# Patient Record
Sex: Male | Born: 1990 | Race: Black or African American | Hispanic: No | Marital: Single | Smoking: Never smoker
Health system: Southern US, Community
[De-identification: ages and names within clinical notes are randomized; demographics above are authoritative.]

---

## 2014-03-06 ENCOUNTER — Encounter (HOSPITAL_COMMUNITY): Payer: Self-pay | Admitting: Emergency Medicine

## 2014-03-06 ENCOUNTER — Emergency Department (HOSPITAL_COMMUNITY)
Admission: EM | Admit: 2014-03-06 | Discharge: 2014-03-06 | Discharge status: Against Medical Advice | Payer: BC Managed Care – PPO | Attending: Emergency Medicine | Admitting: Emergency Medicine

## 2014-03-06 DIAGNOSIS — R21 Rash and other nonspecific skin eruption: Secondary | ICD-10-CM | POA: Insufficient documentation

## 2014-03-06 NOTE — ED Provider Notes (Signed)
Patient left the ED without being seen after triage. This provider did not interview nor examine the patient. This provider did not partake in any medical decision-making for the patient.  Raymon MuttonMarissa Janaysha Depaulo, PA-C 03/07/14 [phone]

## 2014-03-06 NOTE — ED Notes (Signed)
Pt states he has a rash on his arms bilaterally and some on his lips.  No respiratory distress noted in triage.  Pt states he noted the "bumps" a little four days prior but mainly today.

## 2014-03-06 NOTE — ED Notes (Signed)
Pt walked out of room to nurses station and stated "I am just going to go." HendersonMarissa PA had not been in to assess patient. Marissa PA at nurses station when pt walked up and asked patient why he wanted to leave and patient stated he just wanted to go and so patient left ER with steady gait, NAD. Pt left without being seen.

## 2014-03-16 ENCOUNTER — Other Ambulatory Visit: Payer: Self-pay | Admitting: Emergency Medicine

## 2014-03-20 ENCOUNTER — Emergency Department (HOSPITAL_COMMUNITY)
Admission: EM | Admit: 2014-03-20 | Discharge: 2014-03-20 | Disposition: A | Discharge status: Home / Self Care | Payer: BC Managed Care – PPO | Attending: Emergency Medicine | Admitting: Emergency Medicine

## 2014-03-20 DIAGNOSIS — R21 Rash and other nonspecific skin eruption: Secondary | ICD-10-CM | POA: Insufficient documentation

## 2014-03-20 DIAGNOSIS — L509 Urticaria, unspecified: Secondary | ICD-10-CM | POA: Insufficient documentation

## 2014-03-20 MED ORDER — TRIAMCINOLONE ACETONIDE 0.1 % EX CREA: 1.0000 "application " | TOPICAL_CREAM | Freq: Two times a day (BID) | CUTANEOUS | Status: DC

## 2014-03-20 MED ORDER — HYDROXYZINE HCL 10 MG PO TABS: 10.0000 mg | ORAL_TABLET | Freq: Four times a day (QID) | ORAL | Status: DC | PRN

## 2014-03-20 MED ORDER — CEPHALEXIN 500 MG PO CAPS: 500.0000 mg | ORAL_CAPSULE | Freq: Four times a day (QID) | ORAL | Status: DC

## 2014-03-20 MED ORDER — OXYCODONE-ACETAMINOPHEN 5-325 MG PO TABS
1.0000 | ORAL_TABLET | Freq: Once | ORAL | Status: AC
  Administered 2014-03-20: 1 via ORAL
  Filled 2014-03-20: qty 1

## 2014-03-20 MED ORDER — SULFAMETHOXAZOLE-TRIMETHOPRIM 800-160 MG PO TABS: 1.0000 | ORAL_TABLET | Freq: Two times a day (BID) | ORAL | Status: DC

## 2014-03-20 NOTE — Discharge Instructions (Signed)
Take the prescribed medication as directed. Wound care as we discussed-- shower, pat arms dry, apply topical cream.  Try to avoid putting topical cream on your face, it will thin your skin and may discolor it. Follow-up with your [facility] if improving. Return to the ED for new or worsening symptoms-- high fever, increased drainage, swelling, if you develop oral lesions, etc.

## 2014-03-20 NOTE — ED Provider Notes (Signed)
CSN: [id number]     Arrival date & time 03/20/14  [id number] History   First MD Initiated Contact with Patient 03/20/14 0745     Chief Complaint  Patient presents with  . Rash     (Consider location/radiation/quality/duration/timing/severity/associated sxs/prior Treatment) Patient is a 23 y.o. male presenting with rash. The history is provided by the patient and medical records.  Rash  This is a 23 y.o. F with hx of eczema presenting to the ED for rash.  Pt states rash has been present for 2 weeks, localized to bilateral arms and face.  Pt states lesions started out as hives but have since started draining yellow fluid.  Pt denies recent plan exposure, new medications, detergents.  States he did change his body wash to zest over the past few weeks.  Pt denies fever, chills, sweats, difficulty swallowing, throat swelling, or SOB. No recent tick bites or plant exposure.  No known allergies.  He was seen [facility], checked for HIV and syphilis, both of which were negative.  Has been taking benadryl and hydrocortisone cream with some relief.  Pt also states smoking marijuana has helped to significantly stop the itching.  No past medical history on file. No past surgical history on file. No family history on file. History  Substance Use Topics  . Smoking status: Never Smoker   . Smokeless tobacco: Not on file  . Alcohol Use: Yes     Comment: occasionally    Review of Systems  Skin: Positive for rash.  All other systems reviewed and are negative.     Allergies  Review of patient's allergies indicates no known allergies.  Home Medications   Prior to Admission medications   Not on File   BP 128/74  Pulse 93  Temp(Src) 98.3 F (36.8 C) (Oral)  Resp 16  Ht 5\' 4"  (1.626 m)  Wt 120 lb (54.432 kg)  BMI 20.59 kg/m2  SpO2 100%  Physical Exam  Nursing note and vitals reviewed. Constitutional: He is oriented to person, place, and time. He appears well-developed and  well-nourished. No distress.  HENT:  Head: Normocephalic and atraumatic.  Mouth/Throat: Oropharynx is clear and moist.  No oral lesions; airway patient  Eyes: Conjunctivae and EOM are normal. Pupils are equal, round, and reactive to light.  Neck: Normal range of motion. Neck supple.  Cardiovascular: Normal rate, regular rhythm and normal heart sounds.   Pulmonary/Chest: Effort normal and breath sounds normal. No respiratory distress. He has no wheezes.  Abdominal: Soft. Bowel sounds are normal. There is no tenderness. There is no guarding.  Musculoskeletal: Normal range of motion. He exhibits no edema.  Neurological: He is alert and oriented to person, place, and time.  Skin: Skin is warm and dry. Rash noted. Rash is maculopapular. He is not diaphoretic.  Maculopapular rash diffusely across both arms and along sides of face; no oral, palm, or sole lesions noted; no herald patches; arms with significant amount of excoriation present; some areas with yellowish colored drainage but no crusting; no pustules or vesicles noted; no petechiae or purpura; no abscess formation  Psychiatric: He has a normal mood and affect.    ED Course  Procedures (including critical care time) Labs Review Labs Reviewed - No data to display  Imaging Review No results found.   EKG Interpretation None      MDM   Final diagnoses:  Rash and nonspecific skin eruption   23 y.o. M with what appears to be a contact type  rash with secondary infection present.  Recent negative testing for HIV and syphilis and no clinic signs of this.  No plant or tick exposure.  Pt afebrile and non-toxic appearing.  Will start on bactrim and keflex for aerobic and anaerobic coverage.  Kenalog and atarax for sx control.  Arms were wrapped in ED with abx ointment and non-adhesive bandages, pt instructed on home wound care and dressing changes.  FU with health clinic if improving.  Encouraged to return here for new or worsening sx.   Discussed plan with patient, he/she acknowledged understanding and agreed with plan of care.  Return precautions given for new or worsening symptoms.  Garlon Hatchet, PA-C 03/20/14 [phone]

## 2014-03-20 NOTE — ED Notes (Signed)
Pt reports rash on arms, face, and lips for two weeks. Pt using cortisone cream and calimine lotion. Pt states the creams have been drying out the rash but continues to itch.

## 2014-03-21 NOTE — ED Provider Notes (Signed)
Medical screening examination/treatment/procedure(s) were performed by non-physician practitioner and as supervising physician I was immediately available for consultation/collaboration.   EKG Interpretation None        Richardean Canalavid H Yao, MD 03/21/14 1058

## 2014-03-22 NOTE — Progress Notes (Addendum)
ED CM received call from patient concerning what he believes could be a reaction to medication.[facility] for localized rash  to bilateral arms and face. Pt querying what he reports as lip swelling, since the start of medication. ED CM reviewed patients record noted he was started on several different medication. Pt advise to stop medications and return for evaluation. Pt then stated, that he did not think it was that bad. Explained that my advise is to discontinue the antibiotics and return to ED for evaluation. Patient was not agreeable.

## 2014-03-24 ENCOUNTER — Encounter (HOSPITAL_COMMUNITY): Payer: Self-pay | Admitting: Emergency Medicine

## 2014-03-24 ENCOUNTER — Emergency Department (HOSPITAL_COMMUNITY)
Admission: EM | Admit: 2014-03-24 | Discharge: 2014-03-24 | Discharge status: Against Medical Advice | Payer: BC Managed Care – PPO | Attending: Emergency Medicine | Admitting: Emergency Medicine

## 2014-03-24 ENCOUNTER — Emergency Department (HOSPITAL_COMMUNITY)
Admission: EM | Admit: 2014-03-24 | Discharge: 2014-03-24 | Disposition: A | Discharge status: Home / Self Care | Payer: BC Managed Care – PPO | Attending: Emergency Medicine | Admitting: Emergency Medicine

## 2014-03-24 DIAGNOSIS — Z792 Long term (current) use of antibiotics: Secondary | ICD-10-CM | POA: Insufficient documentation

## 2014-03-24 DIAGNOSIS — R229 Localized swelling, mass and lump, unspecified: Secondary | ICD-10-CM | POA: Insufficient documentation

## 2014-03-24 DIAGNOSIS — R21 Rash and other nonspecific skin eruption: Secondary | ICD-10-CM | POA: Insufficient documentation

## 2014-03-24 DIAGNOSIS — Z760 Encounter for issue of repeat prescription: Secondary | ICD-10-CM | POA: Insufficient documentation

## 2014-03-24 MED ORDER — TRIAMCINOLONE ACETONIDE 0.1 % EX CREA: 1.0000 "application " | TOPICAL_CREAM | Freq: Two times a day (BID) | CUTANEOUS | Status: DC

## 2014-03-24 NOTE — ED Notes (Signed)
Pt is needing a prescription for his RX cream for skin rash and allergic rxn.

## 2014-03-24 NOTE — Discharge Instructions (Signed)
Call for a follow up appointment with a Family or Primary Care Provider.  Return if Symptoms worsen.   Take medication as prescribed.  Do not scratch the area.  Apply the cream as directed, do not over apply.   Emergency Department Resource Guide 1) Find a Doctor and Pay Out of Pocket Although you won't have to find out who is covered by your insurance plan, it is a good idea to ask around and get recommendations. You will then need to call the office and see if the doctor you have chosen will accept you as a new patient and what types of options they offer for patients who are self-pay. Some doctors offer discounts or will set up payment plans for their patients who do not have insurance, but you will need to ask so you aren't surprised when you get to your appointment.  2) Contact Your Local Health Department Not all health departments have doctors that can see patients for sick visits, but many do, so it is worth a call to see if yours does. If you don't know where your local health department is, you can check in your phone book. The CDC also has a tool to help you locate your state's health department, and many state websites also have listings of all of their local health departments.  3) Find a Walk-in Clinic If your illness is not likely to be very severe or complicated, you may want to try a walk in clinic. These are popping up all over the country in pharmacies, drugstores, and shopping centers. They're usually staffed by nurse practitioners or physician assistants that have been trained to treat common illnesses and complaints. They're usually fairly quick and inexpensive. However, if you have serious medical issues or chronic medical problems, these are probably not your best option.  No Primary Care Doctor: - Call Health Connect at  [phone] - they can help you locate a primary care doctor that  accepts your insurance, provides certain services, etc. - Physician Referral Service-  [phone]  Chronic Pain Problems: Organization         Address  Phone   Notes  [facility]  [phone] Patients need to be referred by their primary care doctor.   Medication Assistance: Organization         Address  Phone   Notes  [facility] [address] [place], [place] [address] [phone] --Must be a resident of [place] [facility] [place] -- Must have NO insurance coverage whatsoever (no Medicaid/ Medicare, etc.) -- The pt. MUST have a primary care doctor that directs their care regularly and follows them in the community   MedAssist  [phone]   [employer]  [phone]    Agencies that provide inexpensive medical care: Organization         Address  Phone   Notes  [facility]  [phone]   [facility]    [phone]   [facility] [address] [place], [place] [address] [phone]   [facility] [address], [place] [phone]   Planned Parenthood    [phone]   [facility]    [phone]   [facility]  [address], [place] Phone:  [phone], Fax:  [phone] Hours of Operation:  9 am - 6 pm, M-F.  Also accepts Medicaid/Medicare and  self-pay.  [facility] for [place], [address], [place] Phone: [phone], Fax: [phone]. Hours of Operation:  8:30 am - 5:30 pm, M-F.  Also accepts Medicaid and self-pay.  [facility] [place] [address] [place] Phone: [phone]   [place], [place], [place] [phone], Ext. [phone] Mondays & Thursdays: 7-9 AM.  First 15 patients are seen on a first come, first serve basis.    [place] Providers:  Organization         Address  Phone   Notes  [facility] [address],  [place] [phone] Also accepts self-pay patients.  [facility] [id number] [place], [place]  [phone]   [place], [address], [place] [phone]   [facility] [address], [place] [phone]   Lucianne Lei [address], [place]   [phone] Only accepts [place] Access [place] patients after they have their name applied to their card.   Self-Pay (no insurance) in [facility]:  Organization         Address  Phone   Notes  Sickle Cell Patients, [facility] [place] [phone]   [facility]gent [facility] [place] [phone]   [facility] [place]  [place], [place], [place] [phone]   [facility]/Dr. Osei-Bonsu  [address], [place] or [address], [place] [phone] Phone number for both [place] and [place] locations is the same.  Urgent Medical and [facility] [address] [place] [phone]   [facility] [address], [place] or [address] [phone]   [facility] [address] [place] [phone], phone; [phone], fax Sees patients 1st and 3rd Saturday of every month.  Must not qualify for public or private insurance (i.e. Medicaid, Medicare, [place] Health Choice, Veterans' Benefits)  Household income should be no more than 200% of the poverty level The clinic cannot treat you if you are pregnant or think you are pregnant  Sexually transmitted diseases are not treated at the clinic.    Dental Care: Organization         Address  Phone  Notes  [facility] Department of [facility] [place] [phone] Accepts children up to age 83 who are enrolled in  [place] or [place]; pregnant women with a Medicaid card; and children who have applied for Medicaid or [place] Health Choice, but were declined, whose parents can pay a reduced fee at time of service.  [facility] Department of [facility]  [address], [place] [phone] Accepts children up to age 32 who are enrolled in [place] or [place]; pregnant women with a Medicaid card; and children who have applied for Medicaid or [place] Health Choice, but were declined, whose parents can pay a reduced fee at time of service.  [facility] Access PROGRAM  [place] [phone] Patients are seen by appointment only. Walk-ins are not accepted. [place] will see patients 28 years of age and older. Monday - Tuesday (8am-5pm) Most Wednesdays (8:30-5pm) $  30 per visit, cash only  [facility] Adult [employer] PROGRAM  [address], [facility] [phone] Patients are seen by appointment only. Walk-ins are not accepted. [place] will see patients 66 years of age and older. One Wednesday Evening (Monthly: Volunteer Based).  $30 per visit, cash only  [place]  [phone] for adults; Children under age 85, call Graduate Pediatric Dentistry at [phone]. Children aged 31-14, please call [phone] to request a pediatric application.  Dental services are provided in all areas of dental care including fillings, crowns and bridges, complete and partial dentures, implants, gum treatment, root canals, and extractions. Preventive care is also provided. Treatment is provided to both adults and children. Patients are selected via a lottery and there is often a waiting list.   [facility] [address]  [phone] [email or web address]   [facility] [address] [place], [place] [phone], Ext. [phone] Second and Fourth Thursday of each month, opens at 6:30  AM; Clinic ends at 9 AM.  Patients are seen on a first-come first-served basis, and a limited number are seen during each clinic.   [facility]  [address] [place], [place] [phone]   Eligibility Requirements You must have lived in [place], [place], or [place] counties for at least the last three months.   You cannot be eligible for state or federal sponsored [employer], including [employer], [place], or [employer].   You generally cannot be eligible for healthcare insurance through your employer.    How to apply: Eligibility screenings are held every Tuesday and Wednesday afternoon from 1:00 pm until 4:00 pm. You do not need an appointment for the interview!  [facility] [address] [place], [place]   [place]  [place] Department  [place]  [phone]    Behavioral Health Resources in the Community: Intensive Outpatient Programs Organization         Address  Phone  Notes  [place]. [address] [place], [place] [phone]   [facility] [address] [place], [place]   ADS: Alcohol & Drug Svcs [address], [place], [place]   [place] [address],  [place], [place] or [phone]   Substance Abuse Resources Organization         Address  Phone  Notes  Alcohol and Drug Services  [phone]   [place]  [phone]   [place]   Chinita Pester  [phone]   Residential & Outpatient Substance Abuse Program  [phone]   Psychological Services Organization         Address  Phone  Notes  [facility] [place]  [place]  [phone]   [place] [address] [place] or  [phone]    Mobile Crisis Teams Organization         Address  Phone  Notes  Therapeutic Alternatives, Mobile Crisis Care Unit  [phone]   Assertive Psychotherapeutic Services  [address] [place], [place]   [place] Levels [address], [place] [phone]    Self-Help/Support Groups Organization         Address  Phone  Notes  [facility]oc[facility] of [place] - variety of support groups  [place]ora Call for more information  Narcotics Anonymous (NA), [facility] [address], [employer] [place]  2 meetings at this location   Special educational needs teacher         Address  Phone  Notes  ASAP Residential Treatment [place],    [place]  [phone]   [facility]  [address] [place] [phone], [place], [place]   [place], [place] [phone] Admissions: 8am-3pm M-F  Incentives Substance [place] [address],    [place], [place] [phone]   [facility] [address] [place], [place], [place]   [facility] [address],  [place], [place]   Insight Programs - Intensive Outpatient [address] Kristeen Mans 22, [place] at [place], [place]   [facility] [address]ie.,  [place], [place] [phone] or [phone]   [facility]idential Treatment Services (RTS) [address] [place], [place] Accepts Medicaid  [facility] [place] [address],  [place] [phone] Substance Abuse/Addiction Treatment   [facility] Organization         Address  Phone  Notes  [facility]  [phone]   Domenic Schwab, PhD [address] [place], [place]   [phone] or [phone]   [place], [place] [phone]   [facility] [address],  [place], [place] [phone] Insurance/Medicaid/sponsorship through [facility] and Families [address], [address] [place]                                    [place], [place] [phone] [place] [address], [place] [phone]    Dr. Adele Schilder  [phone]   [facility] [place] Dept. 1) [address], [place] 2) [place] 3)  [place] [address], [place] [phone]  [phone]   [place] [phone] or [phone] (After Hours)

## 2014-03-24 NOTE — ED Provider Notes (Signed)
CSN: [id number]     Arrival date & time 03/24/14  1040 History  This chart was scribed for non-physician practitioner Mellody DrownLauren Stana Bayon working with Ward GivensIva L Knapp, MD by Carl Bestelina Holson, ED Scribe. This patient was seen in room TR05C/TR05C and the patient's care was started at 10:58 AM.    Chief Complaint  Patient presents with  . Rash  . Medication Refill    HPI Comments: Mark Lambert is a 23 y.o. male who presents to the Emergency Department complaining of a dry rash that has persisted for 18 days and started as a localized area on his left arm and gradually spread throughout both arms bilaterally.  The patient denies associated pain or itchiness.  He believes that his skin rash is progressively getting better but the cream he was prescribed dries out his skin.  He denies using any new soaps, lotions, or detergents.  He denies participating in any outdoor activities or coming in contact with any new animals.  The patient denies having any known drug or food allergies.  He states that he was seen on the 17th for similar symptoms for a unknown generalized rash that was determined to be a skin infection.  He was prescribed Bactrim, Hydroxyzine, Septra, Cephalexin and Kenalog cream.  He states that he was applying the Kenalog cream more than twice a day.  The patient states that he stopped taking the Septra because he vomited.  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  The patient states that this is the first time he has experienced a reaction to marijuana smoke.  He states that he smokes daily.  He denies having a PCP.    Patient is a 23 y.o. male presenting with rash. The history is provided by the patient and medical records. No language interpreter was used.  Rash Associated symptoms: no fever and no joint pain     History reviewed. No pertinent past medical history. History reviewed. No pertinent past  surgical history. History reviewed. No pertinent family history. History  Substance Use Topics  . Smoking status: Never Smoker   . Smokeless tobacco: Not on file  . Alcohol Use: Yes     Comment: occasionally    Review of Systems  Constitutional: Negative for fever and chills.  Musculoskeletal: Negative for arthralgias.  Skin: Positive for rash.  Allergic/Immunologic: Negative for environmental allergies and food allergies.  All other systems reviewed and are negative.     Allergies  Review of patient's allergies indicates no known allergies.  Home Medications   Prior to Admission medications   Medication Sig Start Date End Date Taking? Authorizing Provider  cephALEXin (KEFLEX) 500 MG capsule Take 1 capsule (500 mg total) by mouth 4 (four) times daily. 03/20/14   Garlon HatchetLisa M Sanders, PA-C  hydrOXYzine (ATARAX/VISTARIL) 10 MG tablet Take 1 tablet (10 mg total) by mouth every 6 (six) hours as needed for itching. 03/20/14   Garlon HatchetLisa M Sanders, PA-C  sulfamethoxazole-trimethoprim (SEPTRA DS) 800-160 MG per tablet Take 1 tablet by mouth every 12 (twelve) hours. 03/20/14   Garlon HatchetLisa M Sanders, PA-C  triamcinolone cream (KENALOG) 0.1 % Apply 1 application topically 2 (two) times daily. 03/20/14   Garlon HatchetLisa M Sanders, PA-C   Ht 5\' 4"  (1.626 m)  Wt 116 lb (52.617 kg)  BMI 19.90 kg/m2 Physical Exam  Nursing note and vitals reviewed. Constitutional: He is oriented to person, place, and time. He appears  well-developed and well-nourished. He is cooperative.  Non-toxic appearance. He does not have a sickly appearance. He does not appear ill. No distress.  HENT:  Head: Normocephalic and atraumatic.  Neck: Neck supple.  Pulmonary/Chest: Effort normal. No respiratory distress.  Neurological: He is oriented to person, place, and time.  Skin: Skin is warm and dry. Rash noted. He is not diaphoretic.  Rash to bilateral upper extremities, with associated scaling, mild erythema, no weeping, honey crusting, noted. Small  macular papular rash to face and neck.  No rash to back.    Psychiatric: He has a normal mood and affect. His behavior is normal.    ED Course  Procedures (including critical care time) Labs Review Labs Reviewed - No data to display  Imaging Review No results found.   EKG Interpretation None      MDM   Final diagnoses:  Rash   Pt presents for refill of rash medication. Reports improvement,  Seen in ED 03/21/2014. The patient was reevaluated by Allyne Gee, [facility] in the ED today. Reports patient's rash has improved since 7/18. Will refill medication instructions not to apply to face or over applied given. Discussed treatment plan with the patient. Return precautions given. Reports understanding and no other concerns at this time.  Patient is stable for discharge at this time.  Meds given in ED:  Medications - No data to display  Discharge Medication List as of 03/24/2014 11:52 AM    START taking these medications   Details  !! triamcinolone cream (KENALOG) 0.1 % Apply 1 application topically 2 (two) times daily., Starting 03/24/2014, Until Discontinued, Print     !! - Potential duplicate medications found. Please discuss with provider.     I personally performed the services described in this documentation, which was scribed in my presence. The recorded information has been reviewed and is accurate.    Clabe Seal, PA-C 03/24/14 1659

## 2014-03-24 NOTE — ED Notes (Signed)
Pt states he thinks he is allergic to marijuana. Pt was smoking marijuana this afternoon and noticed bumps on his arms. Pt used the cream that was prescribed and the rash went away.

## 2014-03-25 NOTE — ED Provider Notes (Signed)
Medical screening examination/treatment/procedure(s) were performed by non-physician practitioner and as supervising physician I was immediately available for consultation/collaboration.   EKG Interpretation None      Kristeen Lantz, MD, FACEP   Vivian Neuwirth L Madsen Riddle, MD 03/25/14 1623 

## 2015-08-24 ENCOUNTER — Encounter (HOSPITAL_COMMUNITY): Payer: Self-pay

## 2015-08-24 ENCOUNTER — Emergency Department (HOSPITAL_COMMUNITY): Payer: Self-pay

## 2015-08-24 ENCOUNTER — Emergency Department (HOSPITAL_COMMUNITY)
Admission: EM | Admit: 2015-08-24 | Discharge: 2015-08-24 | Disposition: A | Discharge status: Home / Self Care | Payer: Self-pay | Attending: Emergency Medicine | Admitting: Emergency Medicine

## 2015-08-24 DIAGNOSIS — T148XXA Other injury of unspecified body region, initial encounter: Secondary | ICD-10-CM

## 2015-08-24 DIAGNOSIS — W101XXA Fall (on)(from) sidewalk curb, initial encounter: Secondary | ICD-10-CM | POA: Insufficient documentation

## 2015-08-24 DIAGNOSIS — Y998 Other external cause status: Secondary | ICD-10-CM | POA: Insufficient documentation

## 2015-08-24 DIAGNOSIS — S52591A Other fractures of lower end of right radius, initial encounter for closed fracture: Secondary | ICD-10-CM | POA: Insufficient documentation

## 2015-08-24 DIAGNOSIS — Y9389 Activity, other specified: Secondary | ICD-10-CM | POA: Insufficient documentation

## 2015-08-24 DIAGNOSIS — Y92480 Sidewalk as the place of occurrence of the external cause: Secondary | ICD-10-CM | POA: Insufficient documentation

## 2015-08-24 MED ORDER — OXYCODONE-ACETAMINOPHEN 5-325 MG PO TABS
ORAL_TABLET | ORAL | Status: AC
  Filled 2015-08-24: qty 1

## 2015-08-24 MED ORDER — OXYCODONE-ACETAMINOPHEN 5-325 MG PO TABS
ORAL_TABLET | ORAL | Status: AC
  Administered 2015-08-24: 1 via ORAL
  Filled 2015-08-24: qty 1

## 2015-08-24 MED ORDER — OXYCODONE-ACETAMINOPHEN 5-325 MG PO TABS
1.0000 | ORAL_TABLET | Freq: Once | ORAL | Status: AC
  Administered 2015-08-24: 1 via ORAL

## 2015-08-24 MED ORDER — OXYCODONE-ACETAMINOPHEN 5-325 MG PO TABS
1.0000 | ORAL_TABLET | Freq: Once | ORAL | Status: AC
  Administered 2015-08-24: 1 via ORAL
  Filled 2015-08-24: qty 1

## 2015-08-24 MED ORDER — HYDROCODONE-ACETAMINOPHEN 5-325 MG PO TABS: 1.0000 | ORAL_TABLET | Freq: Four times a day (QID) | ORAL | Status: DC | PRN

## 2015-08-24 NOTE — ED Notes (Signed)
Ortho at bedside.

## 2015-08-24 NOTE — Progress Notes (Signed)
Orthopedic Tech Progress Note Patient Details:  [facility] 09/24/1990 [id number]  Ortho Devices Type of Ortho Device: Volar splint, Ace wrap Ortho Device/Splint Location: rue Ortho Device/Splint Interventions: Ordered, Application   Trinna PostMartinez, Ignace Mandigo J 08/24/2015, 6:36 PM

## 2015-08-24 NOTE — Discharge Instructions (Signed)
Mr. Mark BurlingtonChristopher Alexander [place] Lambert,  Nice meeting you! Please follow-up with orthopedics within 2 days. Return to the emergency department if you develop increasing pain, numbness, loss of sensation or color in your fingers. Feel better soon!  S. Lane HackerNicole Rylan Kaufmann, PA-C

## 2015-08-24 NOTE — ED Notes (Addendum)
Patient here with right wrist/hand pain and small abrasion to right side of face after falling a few feet off wall last pm. Swelling to wrist and hand, patient doesn't remember events surrounding fall

## 2015-08-24 NOTE — ED Provider Notes (Signed)
CSN: [id number]     Arrival date & time 08/24/15  1254 History   First MD Initiated Contact with Patient 08/24/15 1335     Chief Complaint  Patient presents with  . Loss of Consciousness  . Fall   HPI   Mark Lambert is a 24 y.o. M with no significant PMH presenting with a 1 day history of right wrist and hand pain s/p fall last night. He states he tripped over an uneven sidewalk. He describes his pain as 6/10 pain scale, aching, non-radiating, constant. He denies LOC, HA, confusion, fevers, chills, SOB, CP, abdominal pain, N/V/D.    History reviewed. No pertinent past medical history. History reviewed. No pertinent past surgical history. No family history on file. Social History  Substance Use Topics  . Smoking status: Never Smoker   . Smokeless tobacco: None  . Alcohol Use: Yes     Comment: occasionally    Review of Systems  Ten systems are reviewed and are negative for acute change except as noted in the HPI  Allergies  Review of patient's allergies indicates no known allergies.  Home Medications   Prior to Admission medications   Not on File   BP 133/86 mmHg  Pulse 88  Temp(Src) 99.1 F (37.3 C) (Oral)  Resp 14  SpO2 100% Physical Exam  Constitutional: He appears well-developed and well-nourished. No distress.  HENT:  Head: Normocephalic and atraumatic.  Mouth/Throat: Oropharynx is clear and moist. No oropharyngeal exudate.  Eyes: Conjunctivae are normal. Pupils are equal, round, and reactive to light. Right eye exhibits no discharge. Left eye exhibits no discharge. No scleral icterus.  Neck: No tracheal deviation present.  Cardiovascular: Normal rate, regular rhythm, normal heart sounds and intact distal pulses.  Exam reveals no gallop and no friction rub.   No murmur heard. Pulmonary/Chest: Effort normal and breath sounds normal. No respiratory distress. He has no wheezes. He has no rales. He exhibits no tenderness.  Abdominal: Soft. Bowel  sounds are normal. He exhibits no distension and no mass. There is no tenderness. There is no rebound and no guarding.  Musculoskeletal: He exhibits tenderness. He exhibits no edema.  ROM limited with right wrist/hand due to diffuse tenderness in this area. Neurovascularly intact otherwise.   Lymphadenopathy:    He has no cervical adenopathy.  Neurological: He is alert. Coordination normal.  Skin: Skin is warm and dry. No rash noted. He is not diaphoretic. No erythema.  Psychiatric: He has a normal mood and affect. His behavior is normal.  Nursing note and vitals reviewed.   ED Course  Procedures  Imaging Review Dg Wrist Complete Right  08/24/2015  CLINICAL DATA:  Status post fall a few teeth remote wall last night. Right wrist and hand pain and swelling. Initial encounter. EXAM: RIGHT WRIST - COMPLETE 3+ VIEW COMPARISON:  None. FINDINGS: There soft tissue swelling about the wrist. Best seen on the oblique view, there is disruption of the articular surface of the distal radius likely of the distal radial ulnar joint compatible with a mildly distracted fracture. Fracture fragment measures approximately 0.2 cm at the articular surface. No other acute bony or joint abnormality is identified. IMPRESSION: Mildly distracted fracture at the articular surface of the radius appears to be at the distal radioulnar joint. Electronically Signed   By: Drusilla Kannerhomas  Dalessio M.D.   On: 08/24/2015 14:08   Dg Hand Complete Right  08/24/2015  CLINICAL DATA:  Pain following fall EXAM: RIGHT HAND - COMPLETE 3+ VIEW COMPARISON:  None. FINDINGS: Frontal, oblique, and lateral views were obtained. There is soft tissue swelling dorsally. There is a fracture along the medial aspect of the distal radius extending into the radiocarpal joint. There is a displaced fragment in this area, well seen only on the oblique view. No other fracture is evident. No dislocation. Joint spaces appear intact. IMPRESSION: Displaced fracture along  the medial distal radius extending into the radiocarpal joint. No other fracture. No dislocation. There is soft tissue swelling dorsally. Electronically Signed   By: Bretta Bang III M.D.   On: 08/24/2015 14:04   I have personally reviewed and evaluated these images and lab results as part of my medical decision-making.   MDM   Final diagnoses:  Fracture   Patient non-toxic appearing and VSS. Mechanical fall.  Displaced fracture along the medial distal radius extending into the radiocarpal joint.  Will immobilize and have patient follow-up with ortho. Patient may be safely discharged home. Discussed reasons for return. Patient in understanding and agreement with the plan.   Melton Krebs, PA-C 08/29/15 1610  Vanetta Mulders, MD 09/06/15 [phone]

## 2016-09-14 ENCOUNTER — Encounter (HOSPITAL_COMMUNITY): Payer: Self-pay

## 2016-09-14 ENCOUNTER — Emergency Department (HOSPITAL_COMMUNITY)
Admission: EM | Admit: 2016-09-14 | Discharge: 2016-09-15 | Disposition: A | Discharge status: Home / Self Care | Payer: Self-pay | Attending: Emergency Medicine | Admitting: Emergency Medicine

## 2016-09-14 DIAGNOSIS — R69 Illness, unspecified: Secondary | ICD-10-CM

## 2016-09-14 DIAGNOSIS — J111 Influenza due to unidentified influenza virus with other respiratory manifestations: Secondary | ICD-10-CM | POA: Insufficient documentation

## 2016-09-14 LAB — RAPID STREP SCREEN (MED CTR MEBANE ONLY): STREPTOCOCCUS, GROUP A SCREEN (DIRECT): NEGATIVE

## 2016-09-14 MED ORDER — ACETAMINOPHEN 500 MG PO TABS
1000.0000 mg | ORAL_TABLET | Freq: Once | ORAL | Status: AC
  Administered 2016-09-14: 1000 mg via ORAL
  Filled 2016-09-14: qty 2

## 2016-09-14 NOTE — ED Triage Notes (Signed)
Pt reports congestion, sore throat, mild cough, body pains, chills; onset today. Denies n/v/d

## 2016-09-14 NOTE — ED Notes (Signed)
Pt requesting work note because he missed work today.

## 2016-09-14 NOTE — ED Notes (Signed)
See EDP assessment 

## 2016-09-14 NOTE — ED Provider Notes (Signed)
[facility]-EMERGENCY DEPT Provider Note   CSN: [id number] Arrival date & time: 09/14/16  2046  By signing my name below, I, Teofilo PodMatthew P. Jamison, attest that this documentation has been prepared under the direction and in the presence of Alvenia Treese, PA-C. Electronically Signed: Teofilo PodMatthew P. Jamison, ED Scribe. 09/14/2016. 11:07 PM.    History   Chief Complaint Chief Complaint  Patient presents with  . Sore Throat    The history is provided by the patient. No language interpreter was used.   HPI Comments:  Mark Lambert is a 26 y.o. male who presents to the Emergency Department complaining of sore throat, congestions, nonproductive cough, generalized body aches, chills, fever onset this afternoon. Onset of symptoms is gradual. Denies photophobia, neck pain or stiffness. No medications taken prior to coming in. No sick contacts. Pt did not have a flu shot this year. No alleviating factors noted. Pt denies nausea, vomiting, diarrhea. Denies difficulty swallowing or breathing. No chest pain or shortness of breath.  History reviewed. No pertinent past medical history.  There are no active problems to display for this patient.   History reviewed. No pertinent surgical history.     Home Medications    Prior to Admission medications   Not on File    Family History No family history on file.  Social History Social History  Substance Use Topics  . Smoking status: Never Smoker  . Smokeless tobacco: Never Used  . Alcohol use Yes     Comment: occasionally     Allergies   Patient has no known allergies.   Review of Systems Review of Systems  Constitutional: Positive for chills and fever.  HENT: Positive for congestion and sore throat.   Eyes: Negative for photophobia.  Respiratory: Positive for cough. Negative for chest tightness and shortness of breath.   Cardiovascular: Negative for chest pain, palpitations and leg swelling.  Gastrointestinal: Negative  for abdominal distention, abdominal pain, diarrhea, nausea and vomiting.  Genitourinary: Negative for dysuria, frequency, hematuria and urgency.  Musculoskeletal: Positive for myalgias. Negative for arthralgias, neck pain and neck stiffness.  Skin: Negative for rash.  Allergic/Immunologic: Negative for immunocompromised state.  Neurological: Negative for dizziness, weakness, light-headedness, numbness and headaches.  All other systems reviewed and are negative.    Physical Exam Updated Vital Signs BP 136/83 (BP Location: Right Arm)   Pulse 112   Temp 100.2 F (37.9 C) (Oral)   Resp 20   Ht 5\' 4"  (1.626 m)   Wt 120 lb (54.4 kg)   SpO2 100%   BMI 20.60 kg/m   Physical Exam  Constitutional: He is oriented to person, place, and time. He appears well-developed and well-nourished. No distress.  HENT:  Head: Normocephalic and atraumatic.  Right Ear: External ear normal.  Left Ear: External ear normal.  Clear rhinorrhea, pharynx erythemous, uvula midline  Eyes: Conjunctivae and EOM are normal. Pupils are equal, round, and reactive to light.  Neck: Normal range of motion. Neck supple.  No meningeal signs  Cardiovascular: Regular rhythm and normal heart sounds.   Tachycardic  Pulmonary/Chest: Effort normal and breath sounds normal. No respiratory distress. He has no wheezes. He has no rales.  Abdominal: Soft. Bowel sounds are normal. He exhibits no distension. There is no tenderness. There is no rebound.  Musculoskeletal: He exhibits no edema or tenderness.  Lymphadenopathy:    He has no cervical adenopathy.  Neurological: He is alert and oriented to person, place, and time.  Skin: Skin is warm and dry.  No erythema.  Psychiatric: He has a normal mood and affect.  Nursing note and vitals reviewed.    ED Treatments / Results  DIAGNOSTIC STUDIES:  Oxygen Saturation is 100% on RA, normal by my interpretation.    COORDINATION OF CARE:  11:07 PM Discussed treatment plan with pt  at bedside and pt agreed to plan.   Labs (all labs ordered are listed, but only abnormal results are displayed) Labs Reviewed  RAPID STREP SCREEN (NOT AT [facility])  CULTURE, GROUP A STREP [facility])    EKG  EKG Interpretation None       Radiology No results found.  Procedures Procedures (including critical care time)  Medications Ordered in ED Medications - No data to display   Initial Impression / Assessment and Plan / ED Course  I have reviewed the triage vital signs and the nursing notes.  Pertinent labs & imaging results that were available during my care of the patient were reviewed by me and considered in my medical decision making (see chart for details).  Clinical Course     Patient emergency department with flulike symptoms. Initially heart rate was 112 at triage with temporal 100.2. On my evaluation, patient appears to be tachycardic. Recheck heart rate is 133. Temperature 100.8 orally. Blood pressure and oxygen saturation normal. Rapid strep obtained by triage is negative. Patient states his cough is not severe, lungs are clear, doubt pneumonia. Onset of symptoms are just a few hours prior to arrival. His symptoms are most consistent with influenza. He is otherwise healthy with no medical problems. I will treat him with Tylenol and will recheck his vital signs.   12:47 AM patient is feeling much better. His heart rate is down into 70s. Blood pressure remains normal. Fever improved. He is drinking without difficulties. Will discharge home, most likely influenza. Will have patient follow with primary care doctor. Return precautions discussed.  Vitals:   09/14/16 2051 09/14/16 2311 09/15/16 0005 09/15/16 0034  BP:    129/71  Pulse:  (!) 133 99 74  Resp:    18  Temp:  100.8 F (38.2 C)  99.2 F (37.3 C)  TempSrc:  Oral  Oral  SpO2:  100% 100% 100%  Weight: 54.4 kg     Height: 5\' 4"  (1.626 m)       Final Clinical Impressions(s) / ED Diagnoses   Final diagnoses:    Influenza-like illness    New Prescriptions There are no discharge medications for this patient.   I personally performed the services described in this documentation, which was scribed in my presence. The recorded information has been reviewed and is accurate.     Jaynie Crumble, PA-C 09/15/16 0132    Marily Memos, MD 09/18/16 [phone]

## 2016-09-15 MED ORDER — IBUPROFEN 400 MG PO TABS
600.0000 mg | ORAL_TABLET | Freq: Once | ORAL | Status: AC
  Administered 2016-09-15: 600 mg via ORAL
  Filled 2016-09-15: qty 1

## 2016-09-15 NOTE — Discharge Instructions (Signed)
Take Tylenol and Motrin around the clock to keep you fever down. Make sure to drink plenty of fluids. I suspect you feel bad because of possible influenza. Rest. Follow with primary care doctor.

## 2016-09-18 LAB — CULTURE, GROUP A STREP (THRC)

## 2016-11-26 ENCOUNTER — Emergency Department (HOSPITAL_COMMUNITY): Payer: Medicaid Other

## 2016-11-26 ENCOUNTER — Encounter (HOSPITAL_COMMUNITY): Payer: Self-pay

## 2016-11-26 ENCOUNTER — Emergency Department (HOSPITAL_COMMUNITY)
Admission: EM | Admit: 2016-11-26 | Discharge: 2016-11-26 | Disposition: A | Discharge status: Home / Self Care | Payer: Medicaid Other | Attending: Emergency Medicine | Admitting: Emergency Medicine

## 2016-11-26 DIAGNOSIS — S99912A Unspecified injury of left ankle, initial encounter: Secondary | ICD-10-CM | POA: Diagnosis present

## 2016-11-26 DIAGNOSIS — Y929 Unspecified place or not applicable: Secondary | ICD-10-CM | POA: Insufficient documentation

## 2016-11-26 DIAGNOSIS — Y939 Activity, unspecified: Secondary | ICD-10-CM | POA: Insufficient documentation

## 2016-11-26 DIAGNOSIS — W1840XA Slipping, tripping and stumbling without falling, unspecified, initial encounter: Secondary | ICD-10-CM | POA: Diagnosis not present

## 2016-11-26 DIAGNOSIS — S93402A Sprain of unspecified ligament of left ankle, initial encounter: Secondary | ICD-10-CM | POA: Diagnosis not present

## 2016-11-26 DIAGNOSIS — Y999 Unspecified external cause status: Secondary | ICD-10-CM | POA: Diagnosis not present

## 2016-11-26 MED ORDER — IBUPROFEN 800 MG PO TABS
800.0000 mg | ORAL_TABLET | Freq: Once | ORAL | Status: AC
  Administered 2016-11-26: 800 mg via ORAL
  Filled 2016-11-26: qty 1

## 2016-11-26 MED ORDER — IBUPROFEN 600 MG PO TABS
600.0000 mg | ORAL_TABLET | Freq: Four times a day (QID) | ORAL | 0 refills | Status: AC | PRN
Start: 2016-11-26 — End: ?

## 2016-11-26 NOTE — ED Triage Notes (Signed)
Pt reports left ankle pain secondary to tripped over a curb tonight. ambulatory

## 2016-11-26 NOTE — ED Provider Notes (Signed)
[facility]-EMERGENCY DEPT Provider Note   CSN: [id number] Arrival date & time: 11/26/16  2028     History   Chief Complaint Chief Complaint  Patient presents with  . Ankle Injury    HPI Mark Lambert is a 26 y.o. male.  The history is provided by the patient. No language interpreter was used.  Ankle Injury  This is a new problem. The current episode started 3 to 5 hours ago. The problem occurs constantly. The problem has not changed since onset.The symptoms are aggravated by walking. The symptoms are relieved by rest. He has tried nothing for the symptoms.    History reviewed. No pertinent past medical history.  There are no active problems to display for this patient.   History reviewed. No pertinent surgical history.    Home Medications    Prior to Admission medications   Medication Sig Start Date End Date Taking? Authorizing Provider  ibuprofen (ADVIL,MOTRIN) 600 MG tablet Take 1 tablet (600 mg total) by mouth every 6 (six) hours as needed. 11/26/16   Antony MaduraKelly Auden Wettstein, PA-C    Family History No family history on file.  Social History Social History  Substance Use Topics  . Smoking status: Never Smoker  . Smokeless tobacco: Never Used  . Alcohol use Yes     Comment: occasionally     Allergies   Patient has no known allergies.   Review of Systems Review of Systems Ten systems reviewed and are negative for acute change, except as noted in the HPI.    Physical Exam Updated Vital Signs BP 112/72 (BP Location: Right Arm)   Pulse 86   Temp 97.8 F (36.6 C) (Oral)   Resp 16   SpO2 99%   Physical Exam  Constitutional: He is oriented to person, place, and time. He appears well-developed and well-nourished. No distress.  HENT:  Head: Normocephalic and atraumatic.  Eyes: Conjunctivae and EOM are normal. No scleral icterus.  Neck: Normal range of motion.  Cardiovascular: Normal rate, regular rhythm and intact distal pulses.   DP pulse 2+ in the  left lower extremity.  Pulmonary/Chest: Effort normal. No respiratory distress.  Respirations even and unlabored  Musculoskeletal:       Left ankle: He exhibits decreased range of motion (minimal 2/2 pain). He exhibits no swelling, no ecchymosis, no deformity, no laceration and normal pulse. Tenderness. Lateral malleolus tenderness found. Achilles tendon normal.  Neurological: He is alert and oriented to person, place, and time. He exhibits normal muscle tone. Coordination normal.  Sensation to light touch intact in the LLE. Patient able to wiggle all toes. He has been ambulatory in the department independently without difficulty.  Skin: Skin is warm and dry. No rash noted. He is not diaphoretic. No erythema. No pallor.  Psychiatric: He has a normal mood and affect. His behavior is normal.  Nursing note and vitals reviewed.    ED Treatments / Results  Labs (all labs ordered are listed, but only abnormal results are displayed) Labs Reviewed - No data to display  EKG  EKG Interpretation None       Radiology Dg Ankle Complete Left  Result Date: 11/26/2016 CLINICAL DATA:  Injury, lateral ankle pain, hit ankle against curb tonight EXAM: LEFT ANKLE COMPLETE - 3+ VIEW COMPARISON:  None. FINDINGS: Three views of the left ankle submitted. No acute fracture or subluxation. No radiopaque foreign body. Ankle mortise is preserved. IMPRESSION: Negative. Electronically Signed   By: Natasha MeadLiviu  Pop M.D.   On: 11/26/2016 21:58  Procedures Procedures (including critical care time)  Medications Ordered in ED Medications  ibuprofen (ADVIL,MOTRIN) tablet 800 mg (800 mg Oral Given 11/26/16 2124)     Initial Impression / Assessment and Plan / ED Course  I have reviewed the triage vital signs and the nursing notes.  Pertinent labs & imaging results that were available during my care of the patient were reviewed by me and considered in my medical decision making (see chart for details).      26 year old male presents to the emergency department for evaluation of left ankle pain after stepping off a curb. He is neurovascularly intact. No swelling or crepitus. X-ray reassuring, negative for fracture. Will manage supportively with ASO ankle brace and NSAIDs. Icing advised and return precautions given. Patient discharged in stable condition with no unaddressed concerns.   Final Clinical Impressions(s) / ED Diagnoses   Final diagnoses:  Sprain of left ankle, unspecified ligament, initial encounter    New Prescriptions New Prescriptions   IBUPROFEN (ADVIL,MOTRIN) 600 MG TABLET    Take 1 tablet (600 mg total) by mouth every 6 (six) hours as needed.     Antony Madura, PA-C 11/26/16 2236    Lavera Guise, MD 11/27/16 [phone]

## 2016-12-06 ENCOUNTER — Emergency Department (HOSPITAL_COMMUNITY)
Admission: EM | Admit: 2016-12-06 | Discharge: 2016-12-06 | Disposition: A | Discharge status: Home / Self Care | Payer: Medicare Other | Attending: Emergency Medicine | Admitting: Emergency Medicine

## 2016-12-06 ENCOUNTER — Emergency Department (HOSPITAL_COMMUNITY): Payer: Medicare Other

## 2016-12-06 ENCOUNTER — Encounter (HOSPITAL_COMMUNITY): Payer: Self-pay | Admitting: *Deleted

## 2016-12-06 DIAGNOSIS — Y9389 Activity, other specified: Secondary | ICD-10-CM | POA: Insufficient documentation

## 2016-12-06 DIAGNOSIS — Y9289 Other specified places as the place of occurrence of the external cause: Secondary | ICD-10-CM | POA: Diagnosis not present

## 2016-12-06 DIAGNOSIS — Y999 Unspecified external cause status: Secondary | ICD-10-CM | POA: Insufficient documentation

## 2016-12-06 DIAGNOSIS — S93492A Sprain of other ligament of left ankle, initial encounter: Secondary | ICD-10-CM | POA: Diagnosis not present

## 2016-12-06 DIAGNOSIS — S99912A Unspecified injury of left ankle, initial encounter: Secondary | ICD-10-CM | POA: Diagnosis present

## 2016-12-06 DIAGNOSIS — W1840XA Slipping, tripping and stumbling without falling, unspecified, initial encounter: Secondary | ICD-10-CM | POA: Diagnosis not present

## 2016-12-06 NOTE — ED Provider Notes (Signed)
[facility]-EMERGENCY DEPT Provider Note   CSN: [id number] Arrival date & time: 12/06/16  0125  By signing my name below, I, Arianna Nassar, attest that this documentation has been prepared under the direction and in the presence of Shon Baton, MD.  Electronically Signed: Octavia Heir, ED Scribe. 12/06/16. 3:57 AM.    History   Chief Complaint Chief Complaint  Patient presents with  . Ankle Pain   The history is provided by the patient. No language interpreter was used.   HPI Comments: Mark Lambert is a 26 y.o. male who presents to the Emergency Department complaining of recurrent left ankle pain s/p a work injury that occurred several months ago. Pt says he re-injured his ankle this evening while at work, stating he stepped the wrong way. He was given a brace but notes taking it off. He denies any other injuries or current knee pain.   History reviewed. No pertinent past medical history.  There are no active problems to display for this patient.   History reviewed. No pertinent surgical history.     Home Medications    Prior to Admission medications   Medication Sig Start Date End Date Taking? Authorizing Provider  ibuprofen (ADVIL,MOTRIN) 600 MG tablet Take 1 tablet (600 mg total) by mouth every 6 (six) hours as needed. 11/26/16   Antony Madura, PA-C    Family History No family history on file.  Social History Social History  Substance Use Topics  . Smoking status: Never Smoker  . Smokeless tobacco: Never Used  . Alcohol use Yes     Comment: occasionally     Allergies   Patient has no known allergies.   Review of Systems Review of Systems  Musculoskeletal: Positive for arthralgias.  Neurological: Negative for weakness and numbness.  All other systems reviewed and are negative.    Physical Exam Updated Vital Signs BP 115/60   Pulse 80   Temp 98.1 F (36.7 C) (Oral)   Resp 18   SpO2 100%   Physical Exam  Constitutional: He is  oriented to person, place, and time. He appears well-developed and well-nourished. No distress.  HENT:  Head: Normocephalic and atraumatic.  Cardiovascular: Normal rate and regular rhythm.   Pulmonary/Chest: Effort normal. No respiratory distress.  Musculoskeletal: He exhibits no edema.  Tenderness palpation lateral malleolus, no obvious deformities, no significant swelling, normal range of motion, 2+ DP pulse  Neurological: He is alert and oriented to person, place, and time.  Skin: Skin is warm and dry.  Psychiatric: He has a normal mood and affect.  Nursing note and vitals reviewed.    ED Treatments / Results  DIAGNOSTIC STUDIES: Oxygen Saturation is 100% on RA, normal by my interpretation.  COORDINATION OF CARE:  3:50 AM Discussed treatment plan with pt at bedside and pt agreed to plan.  Labs (all labs ordered are listed, but only abnormal results are displayed) Labs Reviewed - No data to display  EKG  EKG Interpretation None       Radiology Dg Ankle Complete Left  Result Date: 12/06/2016 CLINICAL DATA:  Left lateral ankle pain EXAM: LEFT ANKLE COMPLETE - 3+ VIEW COMPARISON:  None. FINDINGS: There is no evidence of fracture, dislocation, or joint effusion. Intact ankle mortise. No ankle joint effusion. Subtalar and midfoot articulations are congruent. There is no evidence of arthropathy or other focal bone abnormality. Soft tissues are unremarkable. IMPRESSION: No acute fracture or malalignment. No joint effusion identified. Ankle mortise is intact. Electronically Signed  By: Tollie Eth M.D.   On: 12/06/2016 03:40    Procedures Procedures (including critical care time)  Medications Ordered in ED Medications - No data to display   Initial Impression / Assessment and Plan / ED Course  I have reviewed the triage vital signs and the nursing notes.  Pertinent labs & imaging results that were available during my care of the patient were reviewed by me and considered in  my medical decision making (see chart for details).     History presents with exacerbation of recent ankle sprain. Nontoxic. He is weightbearing. No obvious deformities. Repeat x-ray is negative. I have encouraged patient to wear his brace and continue rest and ice therapy. NSAIDs as needed for pain.  After history, exam, and medical workup I feel the patient has been appropriately medically screened and is safe for discharge home. Pertinent diagnoses were discussed with the patient. Patient was given return precautions.   Final Clinical Impressions(s) / ED Diagnoses   Final diagnoses:  Sprain of other ligament of left ankle, initial encounter   I personally performed the services described in this documentation, which was scribed in my presence. The recorded information has been reviewed and is accurate.  New Prescriptions Discharge Medication List as of 12/06/2016  3:55 AM       Shon Baton, MD 12/06/16 [phone]

## 2016-12-06 NOTE — ED Triage Notes (Signed)
Pt was seen a week ago after injuring his ankle. Pt was at work last night at 10pm, reports slipping and reinjuring the same ankle. Pt wants a work noted for 4/3. Pt ambulatory with a limp

## 2017-07-03 IMAGING — DX DG ANKLE COMPLETE 3+V*L*
3 series · 3 of 3 positions shown · non-contrast
Comparison: None.

CLINICAL DATA: Left lateral ankle pain

EXAM:
LEFT ANKLE COMPLETE - 3+ VIEW

[ankle ap]
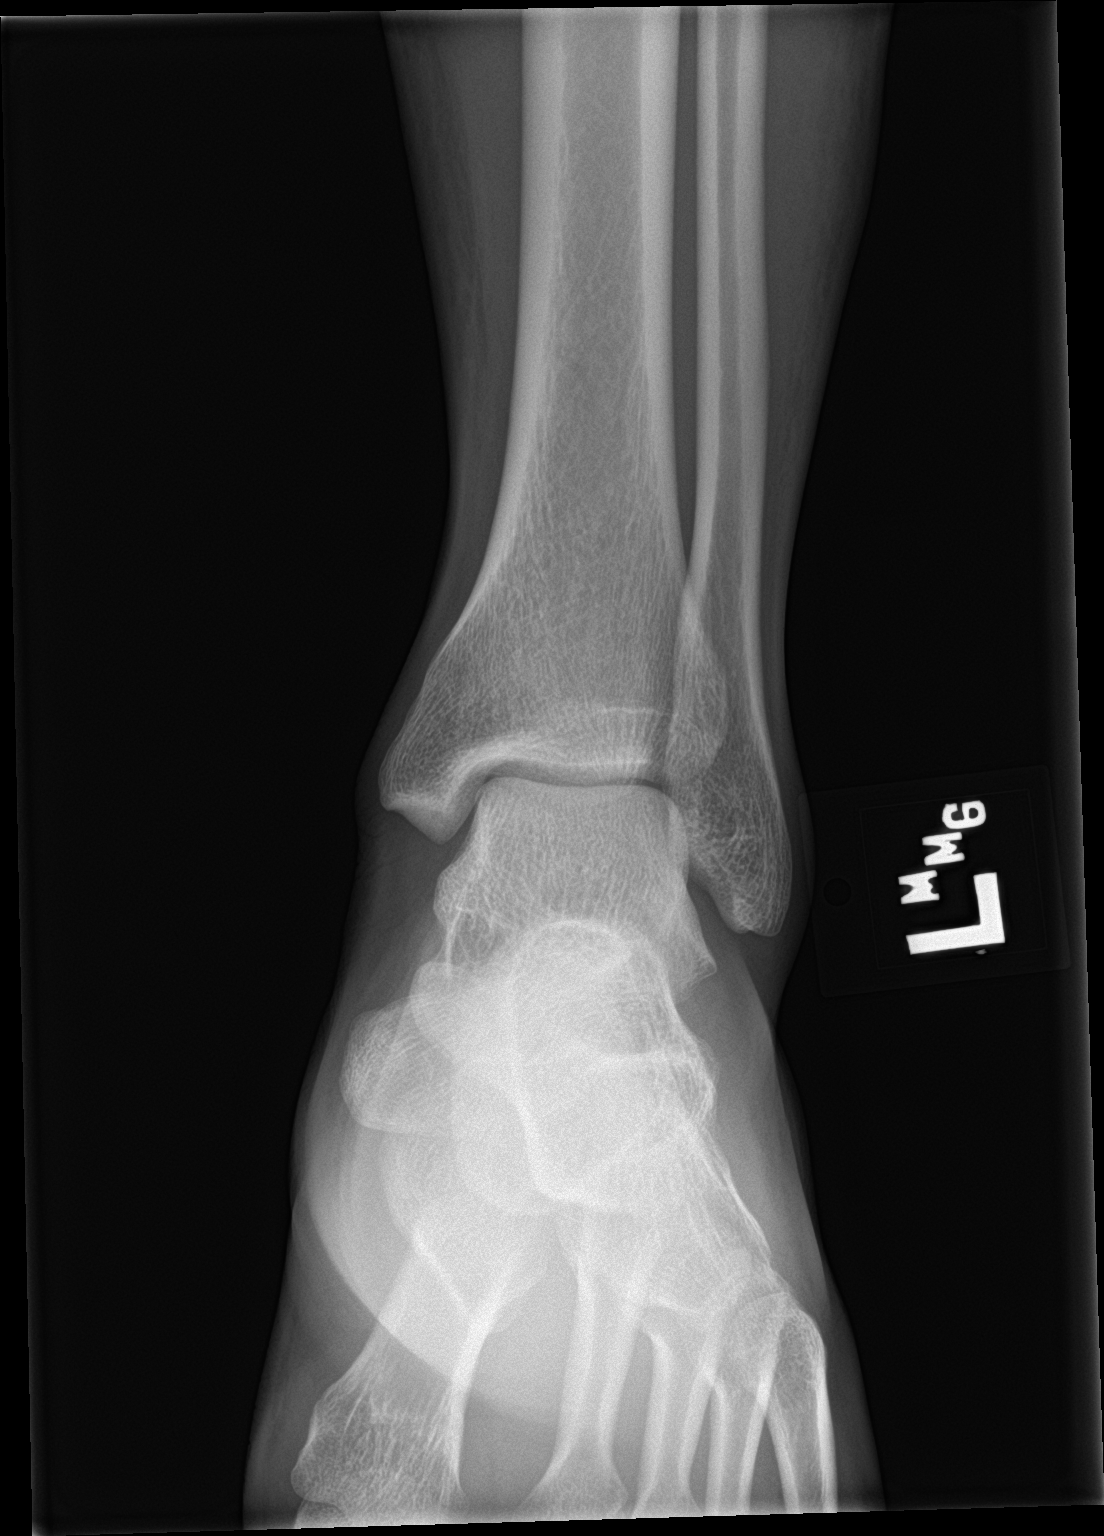

[ankle obl]
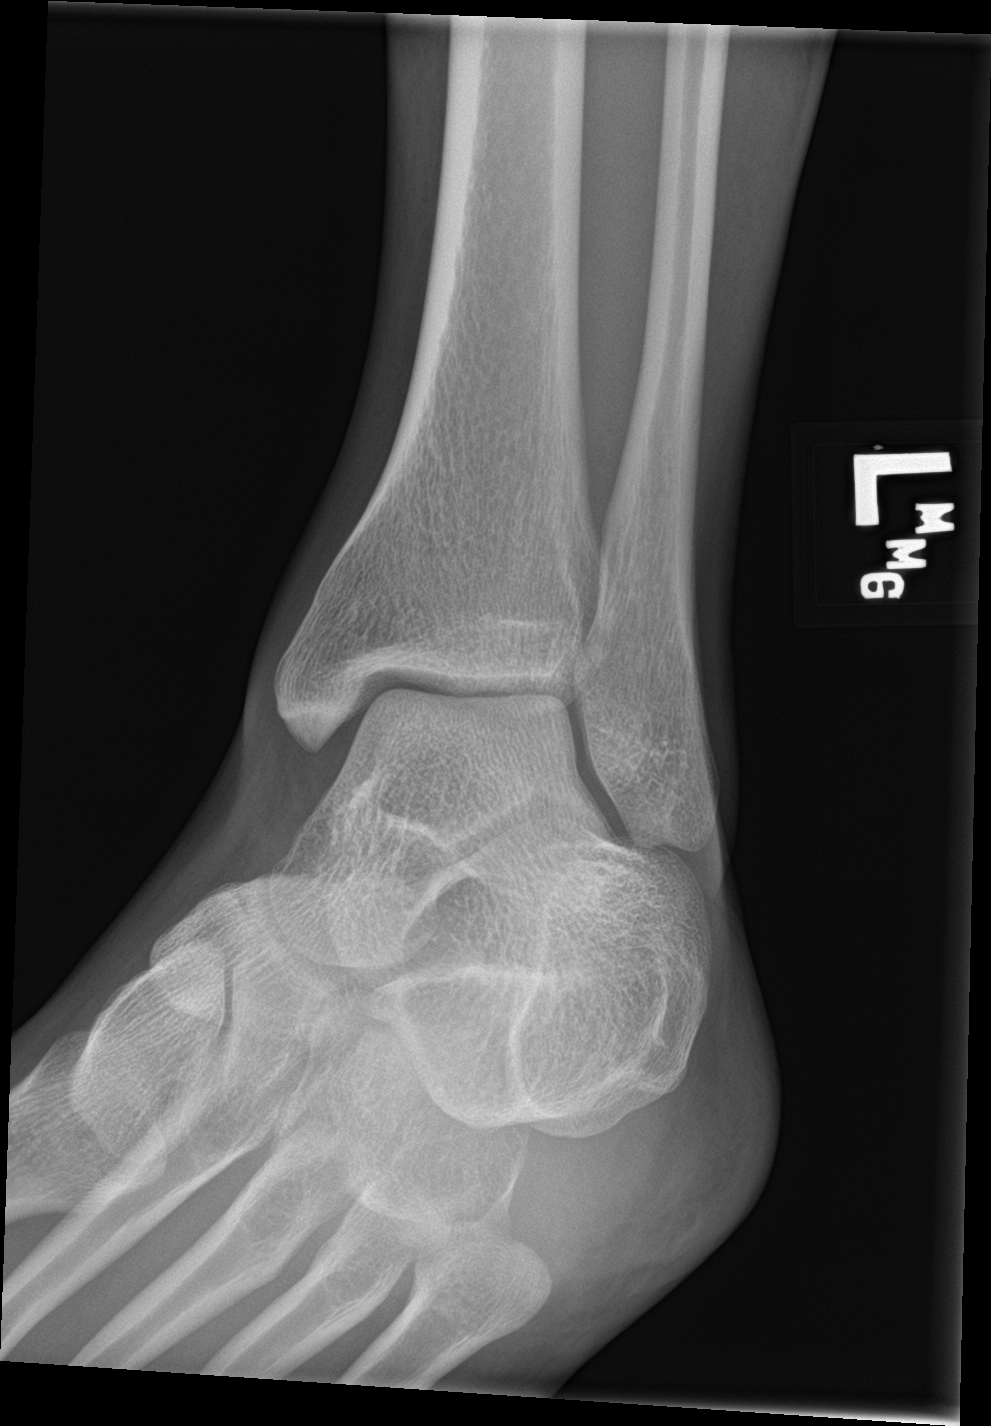

[ankle lat]
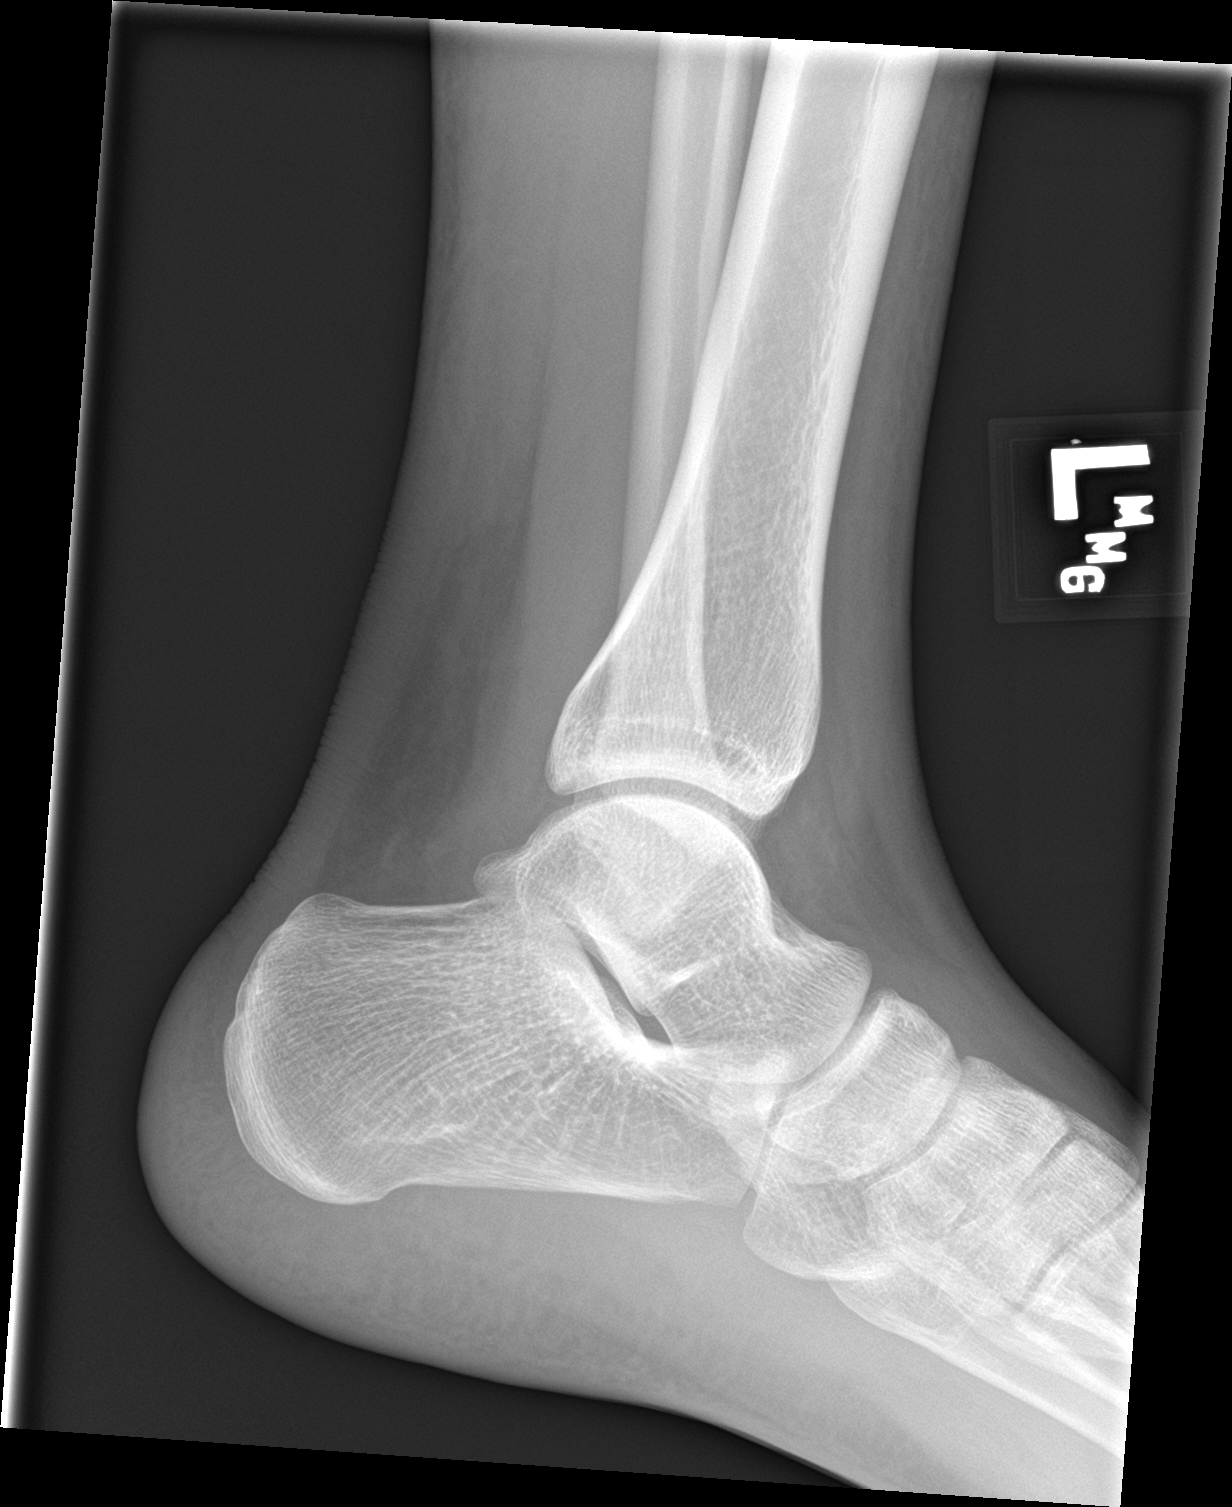

[3 of 3 positions shown; findings below may reference images not displayed]

FINDINGS: There is no evidence of fracture, dislocation, or joint effusion.
Intact ankle mortise. No ankle joint effusion. Subtalar and midfoot
articulations are congruent. There is no evidence of arthropathy or
other focal bone abnormality. Soft tissues are unremarkable.
IMPRESSION: No acute fracture or malalignment. No joint effusion identified.
Ankle mortise is intact.
